# Patient Record
Sex: Female | Born: 1995
Health system: Southern US, Community
[De-identification: ages and names within clinical notes are randomized; demographics above are authoritative.]

## PROBLEM LIST (undated history)

## (undated) ENCOUNTER — Emergency Department (HOSPITAL_BASED_OUTPATIENT_CLINIC_OR_DEPARTMENT_OTHER): Admission: EM | Payer: Managed Care, Other (non HMO) | Source: Home / Self Care

---
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
## 2015-10-12 ENCOUNTER — Encounter (HOSPITAL_BASED_OUTPATIENT_CLINIC_OR_DEPARTMENT_OTHER): Payer: Self-pay | Admitting: Emergency Medicine

## 2015-10-12 ENCOUNTER — Emergency Department (HOSPITAL_BASED_OUTPATIENT_CLINIC_OR_DEPARTMENT_OTHER)
Admission: EM | Admit: 2015-10-12 | Discharge: 2015-10-12 | Disposition: A | Discharge status: Home / Self Care | Payer: Managed Care, Other (non HMO) | Attending: Emergency Medicine | Admitting: Emergency Medicine

## 2015-10-12 DIAGNOSIS — K644 Residual hemorrhoidal skin tags: Secondary | ICD-10-CM | POA: Insufficient documentation

## 2015-10-12 MED ORDER — HYDROCORTISONE 1 % RE CREA: TOPICAL_CREAM | RECTAL | Status: DC

## 2015-10-12 NOTE — ED Provider Notes (Signed)
CSN: [id number]     Arrival date & time 10/12/15  1824 History   First MD Initiated Contact with Patient 10/12/15 2032     Chief Complaint  Patient presents with  . Hemorrhoids    (Consider location/radiation/quality/duration/timing/severity/associated sxs/prior Treatment) The history is provided by the patient and medical records. No language interpreter was used.    Hailey Soto is an otherwise healthy 20 y.o. female  who presents to the Emergency Department complaining of a painless "bump" located near her rectum. Patient states she was wiping today when she felt it. Not tender to the touch. No abdominal pain or constipation. No blood in stool. No medications taken PTA for symptoms. No hx of similar.    History reviewed. No pertinent past medical history. History reviewed. No pertinent past surgical history. History reviewed. No pertinent family history. Social History  Substance Use Topics  . Smoking status: Never Smoker   . Smokeless tobacco: None  . Alcohol Use: No   OB History    No data available     Review of Systems  Constitutional: Negative for fever.  Gastrointestinal: Negative for nausea, vomiting, abdominal pain, constipation, blood in stool and anal bleeding.      Allergies  Review of patient's allergies indicates no known allergies.  Home Medications   Prior to Admission medications   Medication Sig Start Date End Date Taking? Authorizing Provider  HYDROcodone-acetaminophen (NORCO/VICODIN) 5-325 MG per tablet Take 1 tablet by mouth every 4 (four) hours as needed for pain. 06/21/12   Teressa LowerVrinda Pickering, NP  hydrocortisone (PROCTOCORT) 1 % CREA Apply to affected area twice daily for three days. 10/12/15   Chase PicketJaime Pilcher Tayah Idrovo, PA-C  ibuprofen (ADVIL,MOTRIN) 800 MG tablet Take 1 tablet (800 mg total) by mouth 3 (three) times daily. 04/12/13   April Palumbo, MD   BP 127/73 mmHg  Pulse 83  Temp(Src) 98.5 F (36.9 C) (Oral)  Resp 18  Ht 5\' 5"  (1.651 m)  Wt  56.7 kg  BMI 20.80 kg/m2  SpO2 100%  LMP 10/10/2015 Physical Exam  Constitutional: She is oriented to person, place, and time. She appears well-developed and well-nourished.  HENT:  Head: Normocephalic and atraumatic.  Cardiovascular: Normal rate, regular rhythm and normal heart sounds.  Exam reveals no gallop and no friction rub.   No murmur heard. Pulmonary/Chest: Effort normal and breath sounds normal. No respiratory distress. She has no wheezes. She has no rales.  Abdominal: Soft. Bowel sounds are normal. She exhibits no distension. There is no tenderness.  Genitourinary: Rectal exam shows external hemorrhoid. Rectal exam shows no tenderness.  Neurological: She is alert and oriented to person, place, and time.  Skin: Skin is warm and dry.  Nursing note and vitals reviewed.   ED Course  Procedures (including critical care time) Labs Review Labs Reviewed - No data to display  Imaging Review No results found. I have personally reviewed and evaluated these images and lab results as part of my medical decision-making.   EKG Interpretation None      MDM   Final diagnoses:  External hemorrhoid   Hailey SergeantShaquna Farner presents to ED for feeling a painless lump near her rectum today while wiping. On exam, patient has a nontender external hemorrhoid. No bleeding, not thrombosed. No constipation or straining with bowel movements. Benign abdominal exam. Will treat with steroid cream and have her follow-up with her PCP. Return precautions were discussed and all questions answered.  [facility] Hsptl[facility]ime Pilcher Dayrin Stallone, PA-C 10/12/15 2118  Melene Planan Floyd, DO 10/12/15  2123 

## 2015-10-12 NOTE — ED Notes (Signed)
Patient reports that she has a "bump" near her rectum - denies any Pain

## 2015-10-12 NOTE — Discharge Instructions (Signed)
Apply topical cream and asked her active. Follow-up with her primary physician in the next month. Increase hydration. If you have straining with bowel movements, use Colace (a stool softener). Return to ER for new or worsening symptoms, any additional concerns.

## 2017-05-21 ENCOUNTER — Encounter (HOSPITAL_BASED_OUTPATIENT_CLINIC_OR_DEPARTMENT_OTHER): Payer: Self-pay | Admitting: *Deleted

## 2017-05-21 ENCOUNTER — Emergency Department (HOSPITAL_BASED_OUTPATIENT_CLINIC_OR_DEPARTMENT_OTHER)
Admission: EM | Admit: 2017-05-21 | Discharge: 2017-05-21 | Discharge status: Against Medical Advice | Payer: Managed Care, Other (non HMO) | Attending: Emergency Medicine | Admitting: Emergency Medicine

## 2017-05-21 ENCOUNTER — Other Ambulatory Visit: Payer: Self-pay

## 2017-05-21 DIAGNOSIS — Z5321 Procedure and treatment not carried out due to patient leaving prior to being seen by health care provider: Secondary | ICD-10-CM | POA: Insufficient documentation

## 2017-05-21 DIAGNOSIS — M542 Cervicalgia: Secondary | ICD-10-CM | POA: Insufficient documentation

## 2017-05-21 NOTE — ED Triage Notes (Signed)
Pt c/o " swollen glands and neck pain x 2 years

## 2018-06-07 ENCOUNTER — Encounter (HOSPITAL_BASED_OUTPATIENT_CLINIC_OR_DEPARTMENT_OTHER): Payer: Self-pay | Admitting: Emergency Medicine

## 2018-06-07 ENCOUNTER — Telehealth: Payer: Self-pay | Admitting: *Deleted

## 2018-06-07 ENCOUNTER — Emergency Department (HOSPITAL_BASED_OUTPATIENT_CLINIC_OR_DEPARTMENT_OTHER)
Admission: EM | Admit: 2018-06-07 | Discharge: 2018-06-07 | Disposition: A | Discharge status: Home / Self Care | Payer: Managed Care, Other (non HMO) | Attending: Emergency Medicine | Admitting: Emergency Medicine

## 2018-06-07 ENCOUNTER — Other Ambulatory Visit: Payer: Self-pay

## 2018-06-07 DIAGNOSIS — R300 Dysuria: Secondary | ICD-10-CM | POA: Diagnosis present

## 2018-06-07 DIAGNOSIS — M545 Low back pain, unspecified: Secondary | ICD-10-CM

## 2018-06-07 LAB — PREGNANCY, URINE: Preg Test, Ur: NEGATIVE

## 2018-06-07 LAB — URINALYSIS, ROUTINE W REFLEX MICROSCOPIC
BILIRUBIN URINE: NEGATIVE
Glucose, UA: NEGATIVE mg/dL
Hgb urine dipstick: NEGATIVE
KETONES UR: NEGATIVE mg/dL
Leukocytes,Ua: NEGATIVE
NITRITE: NEGATIVE
PH: 7 (ref 5.0–8.0)
Protein, ur: NEGATIVE mg/dL
SPECIFIC GRAVITY, URINE: 1.015 (ref 1.005–1.030)

## 2018-06-07 NOTE — Telephone Encounter (Signed)
Dr. Laury Axon in not accepting new patients.

## 2018-06-07 NOTE — ED Notes (Signed)
C/o increased urinary freq,  White vaginal dc,  Denies burning w urination  X 1 month  Lower back pain last pm

## 2018-06-07 NOTE — ED Provider Notes (Signed)
[facility] EMERGENCY DEPARTMENT Provider Note   CSN: [id number] Arrival date & time: 06/07/18  1045    History   Chief Complaint Chief Complaint  Patient presents with  . Dysuria  . Flank Pain    HPI Hailey Soto is a 23 y.o. female.     23 year old female presents with complaint of low back discomfort with suprapubic cramping intermittent x1 month.  Reports frequent urinary tract infections, last seen in urgent care 1 month ago and diagnosed with UTI, prescribed Macrobid, did not complete the full course.  Review of records shows a normal urinalysis and negative urine culture from that visit.  Patient reports fevers described as feeling hot while at work, has not checked her temperature.  Patient reports previously having vaginal discharge, not recently, described as white without odor or itching.  Denies concerns for sexually transmitted diseases.  Denies nausea, vomiting, changes in bowel habits.  Denies any other complaints or concerns.     No past medical history on file.  There are no active problems to display for this patient.   No past surgical history on file.   OB History   No obstetric history on file.      Home Medications    Prior to Admission medications   Not on File    Family History No family history on file.  Social History Social History   Tobacco Use  . Smoking status: Never Smoker  . Smokeless tobacco: Never Used  Substance Use Topics  . Alcohol use: No  . Drug use: No     Allergies   Ibuprofen   Review of Systems Review of Systems  Constitutional: Negative for chills, diaphoresis and fever.  Gastrointestinal: Negative for abdominal pain, constipation, diarrhea, nausea and vomiting.  Genitourinary: Positive for vaginal discharge. Negative for dysuria and hematuria.       Discharge in the past, none at this time.  Musculoskeletal: Positive for back pain.  Skin: Negative for rash and wound.    Allergic/Immunologic: Negative for immunocompromised state.  Hematological: Negative for adenopathy. Does not bruise/bleed easily.  Psychiatric/Behavioral: Negative for confusion.  All other systems reviewed and are negative.    Physical Exam Updated Vital Signs BP 117/68 (BP Location: Left Arm)   Pulse 78   Temp 98.1 F (36.7 C) (Oral)   Resp 16   Ht 5\' 6"  (1.676 m)   Wt 67.6 kg   LMP 05/21/2018   SpO2 100%   BMI 24.05 kg/m   Physical Exam Vitals signs and nursing note reviewed.  Constitutional:      General: She is not in acute distress.    Appearance: She is well-developed. She is not diaphoretic.  HENT:     Head: Normocephalic and atraumatic.  Cardiovascular:     Rate and Rhythm: Normal rate and regular rhythm.     Pulses: Normal pulses.     Heart sounds: Normal heart sounds.  Pulmonary:     Effort: Pulmonary effort is normal.     Breath sounds: Normal breath sounds.  Abdominal:     General: Abdomen is flat.     Tenderness: There is no abdominal tenderness. There is no right CVA tenderness or left CVA tenderness.  Musculoskeletal:        General: No tenderness.     Thoracic back: Normal.     Lumbar back: Normal.  Skin:    General: Skin is warm and dry.     Findings: No erythema or rash.  Neurological:  General: No focal deficit present.     Mental Status: She is alert and oriented to person, place, and time.  Psychiatric:        Behavior: Behavior normal.      ED Treatments / Results  Labs (all labs ordered are listed, but only abnormal results are displayed) Labs Reviewed  URINALYSIS, ROUTINE W REFLEX MICROSCOPIC  PREGNANCY, URINE  GC/CHLAMYDIA PROBE AMP ([place]) NOT AT [facility]    EKG None  Radiology No results found.  Procedures Procedures (including critical care time)  Medications Ordered in ED Medications - No data to display   Initial Impression / Assessment and Plan / ED Course  I have reviewed the triage vital signs and  the nursing notes.  Pertinent labs & imaging results that were available during my care of the patient were reviewed by me and considered in my medical decision making (see chart for details).  Clinical Course as of Jun 07 1144  Fri Jun 07, 2018  2179 23 year old female with complaint of left lower back pain intermittent x1 month, concern for urinary tract infection.  Patient reports lacerated tract infection 1 month ago, treated at urgent care with [facility] however urinalysis was normal and culture was negative.  Sam today patient does not have any CVA tenderness, no musculoskeletal tenderness, abdomen is soft and nontender.  Patient reports discharge in the past however none presently.  Urinalysis is negative, pregnancy test negative.  Due to concerns for recurrent urinary tract infections, gonorrhea chlamydia testing has been sent out.  Recommend patient follow-up with a primary care provider for further evaluation and treatment.   [LM]    Clinical Course User Index [LM] Jeannie Fend, PA-C    Final Clinical Impressions(s) / ED Diagnoses   Final diagnoses:  Acute low back pain without sciatica, unspecified back pain laterality    ED Discharge Orders    None       Jeannie Fend, PA-C 06/07/18 1146    Long, Arlyss Repress, MD 06/08/18 1255

## 2018-06-07 NOTE — Discharge Instructions (Addendum)
Your urine today does not show infection. Take Motrin or Tylenol as needed for back pain.  Drink plenty of water or other hydrating fluids, avoid caffeine.  Follow up with a primary care provider, referral given.

## 2018-06-07 NOTE — Telephone Encounter (Signed)
Called and LVM informing patient that Dr. Laury Axon isn't accepting new patients. Informed patient of other providers and provided call back number.

## 2018-06-07 NOTE — ED Triage Notes (Addendum)
Pt states she is having dysuria and left flank pain for about one month.  Hot flashes and cold chills.  Some white vaginal discharge.

## 2018-06-07 NOTE — Telephone Encounter (Signed)
Copied from CRM [id number]. Topic: Appointment Scheduling - Scheduling Inquiry for Clinic >> Jun 07, 2018 11:53 AM Leafy Ro wrote: Pt has [employer] and is looking for [place] provider [place]. Pt would like to est with dr Zola Button

## 2018-06-10 LAB — GC/CHLAMYDIA PROBE AMP (~~LOC~~) NOT AT ARMC
Chlamydia: NEGATIVE
NEISSERIA GONORRHEA: NEGATIVE

## 2018-10-27 ENCOUNTER — Encounter (HOSPITAL_BASED_OUTPATIENT_CLINIC_OR_DEPARTMENT_OTHER): Payer: Self-pay | Admitting: Emergency Medicine

## 2018-10-27 ENCOUNTER — Other Ambulatory Visit: Payer: Self-pay

## 2018-10-27 ENCOUNTER — Emergency Department (HOSPITAL_BASED_OUTPATIENT_CLINIC_OR_DEPARTMENT_OTHER)
Admission: EM | Admit: 2018-10-27 | Discharge: 2018-10-27 | Disposition: A | Discharge status: Home / Self Care | Payer: Managed Care, Other (non HMO) | Attending: Emergency Medicine | Admitting: Emergency Medicine

## 2018-10-27 DIAGNOSIS — K146 Glossodynia: Secondary | ICD-10-CM | POA: Diagnosis present

## 2018-10-27 DIAGNOSIS — K115 Sialolithiasis: Secondary | ICD-10-CM | POA: Diagnosis not present

## 2018-10-27 MED ORDER — TRAMADOL HCL 50 MG PO TABS: 50.0000 mg | ORAL_TABLET | Freq: Two times a day (BID) | ORAL | 0 refills | Status: AC

## 2018-10-27 MED ORDER — CEPHALEXIN 500 MG PO CAPS: 500.0000 mg | ORAL_CAPSULE | Freq: Four times a day (QID) | ORAL | 0 refills | Status: AC

## 2018-10-27 NOTE — ED Triage Notes (Signed)
Pt states "my glands are swollen and my throat is trying to hurt".

## 2018-10-27 NOTE — ED Provider Notes (Signed)
[facility] EMERGENCY DEPARTMENT Provider Note   CSN: [id number] Arrival date & time: 10/27/18  1247     History   Chief Complaint Chief Complaint  Patient presents with  . Sore Throat    HPI Hailey Soto is a 23 y.o. female.     Patient states she was eating a [employer] last night when she started to develop pain under her tongue.  It is described as a burning pain.  This morning she woke up and felt like her tongue was swollen in the left side of her jaw as well.  She states this is happened about 4 times in the past 4 years.  It is usually associated with eating food, although there is no particular food that causes it.  Patient states that her throat is okay, the pain is mostly localized to under the tongue and left side of the tongue.  She says when this is happened in the past she gets antibiotics from the emergency department or urgent care and that eventually goes away.      History reviewed. No pertinent past medical history.  There are no active problems to display for this patient.   History reviewed. No pertinent surgical history.   OB History   No obstetric history on file.      Home Medications    Prior to Admission medications   Not on File    Family History No family history on file.  Social History Social History   Tobacco Use  . Smoking status: Never Smoker  . Smokeless tobacco: Never Used  Substance Use Topics  . Alcohol use: Yes  . Drug use: No     Allergies   Ibuprofen   Review of Systems Review of Systems  Constitutional: Negative for chills and fever.  HENT: Positive for facial swelling. Negative for congestion, rhinorrhea and sore throat.   Respiratory: Negative for cough and choking.   Cardiovascular: Negative for chest pain.  Gastrointestinal: Negative for abdominal pain, nausea and vomiting.    Physical Exam Updated Vital Signs BP 113/74 (BP Location: Right Arm)   Pulse 78   Temp 98.2 F (36.8 C)  (Oral)   Resp 16   Ht 5\' 5"  (1.651 m)   Wt 68 kg   LMP 09/30/2018   SpO2 99%   BMI 24.96 kg/m   Physical Exam Constitutional:      Appearance: She is normal weight.     Comments: Mild pain.  Patient tearful at times.    HENT:     Head: Normocephalic and atraumatic.     Nose: No congestion or rhinorrhea.     Mouth/Throat:     Mouth: Mucous membranes are moist.     Pharynx: Pharyngeal swelling and posterior oropharyngeal erythema present.     Comments: Under the tongue the patient has some swelling of her salivary glands with some minor ulceration in appearance Neurological:     Mental Status: She is alert.      ED Treatments / Results  Labs (all labs ordered are listed, but only abnormal results are displayed) Labs Reviewed - No data to display  EKG None  Radiology No results found.  Procedures Procedures (including critical care time)  Medications Ordered in ED Medications - No data to display   Initial Impression / Assessment and Plan / ED Course  I have reviewed the triage vital signs and the nursing notes.  Pertinent labs & imaging results that were available during my care of the  patient were reviewed by me and considered in my medical decision making (see chart for details).        Patient is a 23 year old female comes in with a 1 day history of pain under her tongue who woke up this morning with swelling below and to the left side of the tongue.  No other facial swelling or swelling of the buccal mucosa was noted.  No lymphadenopathy was seen on exam.  Patient is afebrile.  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  Patient was given Keflex 500 4 times a day for 7 days as well as 10 of 50 mg tramadol because she developed an allergic rash to both Tylenol and NSAIDs.  Patient was advised to find a primary care provider as she has  not currently, but does have insurance, so that she can treat this more effectively, and perhaps get a referral to ENT if necessary.  Return precautions were discussed and patient sent home.  Final Clinical Impressions(s) / ED Diagnoses   Final diagnoses:  Sialolithiasis    ED Discharge Orders    None       Sandre Kittylson, Manav Pierotti K, MD 10/27/18 1746    Alvira MondaySchlossman, Erin, MD 10/29/18 1108

## 2018-10-27 NOTE — Discharge Instructions (Signed)
I believe your pain under your tongue is caused by salivary duct stones which can become infected occasionally.  We have prescribed you Keflex to take 4 times a day for 7 days to treat for possible infection.  The stones may continue to be a problem for you in the future, you would need to follow-up with a primary care provider who can perhaps refer you to a ear nose and throat specialist for more permanent treatment.  Please try to avoid foods that trigger this such as spicy foods or anything else that you know to cause the symptoms.  You can suck on hard candy or sour candies to help produce saliva to help pass the stone sooner.

## 2018-12-31 ENCOUNTER — Other Ambulatory Visit: Payer: Self-pay

## 2018-12-31 ENCOUNTER — Emergency Department (HOSPITAL_BASED_OUTPATIENT_CLINIC_OR_DEPARTMENT_OTHER): Payer: Managed Care, Other (non HMO)

## 2018-12-31 ENCOUNTER — Encounter (HOSPITAL_BASED_OUTPATIENT_CLINIC_OR_DEPARTMENT_OTHER): Payer: Self-pay | Admitting: Emergency Medicine

## 2018-12-31 ENCOUNTER — Emergency Department (HOSPITAL_BASED_OUTPATIENT_CLINIC_OR_DEPARTMENT_OTHER)
Admission: EM | Admit: 2018-12-31 | Discharge: 2018-12-31 | Disposition: A | Discharge status: Home / Self Care | Payer: Managed Care, Other (non HMO) | Attending: Emergency Medicine | Admitting: Emergency Medicine

## 2018-12-31 DIAGNOSIS — S3992XA Unspecified injury of lower back, initial encounter: Secondary | ICD-10-CM | POA: Diagnosis present

## 2018-12-31 DIAGNOSIS — S39012A Strain of muscle, fascia and tendon of lower back, initial encounter: Secondary | ICD-10-CM | POA: Diagnosis not present

## 2018-12-31 DIAGNOSIS — Y9389 Activity, other specified: Secondary | ICD-10-CM | POA: Diagnosis not present

## 2018-12-31 DIAGNOSIS — R51 Headache: Secondary | ICD-10-CM | POA: Diagnosis not present

## 2018-12-31 DIAGNOSIS — Y999 Unspecified external cause status: Secondary | ICD-10-CM | POA: Insufficient documentation

## 2018-12-31 DIAGNOSIS — Y92411 Interstate highway as the place of occurrence of the external cause: Secondary | ICD-10-CM | POA: Insufficient documentation

## 2018-12-31 DIAGNOSIS — M542 Cervicalgia: Secondary | ICD-10-CM | POA: Diagnosis not present

## 2018-12-31 MED ORDER — CYCLOBENZAPRINE HCL 10 MG PO TABS: 10.0000 mg | ORAL_TABLET | Freq: Three times a day (TID) | ORAL | 0 refills | Status: DC | PRN

## 2018-12-31 MED FILL — CYCLOBENZAPRINE HCL 10 MG T: 10 | 5 days supply | Qty: 15 | Fill #0

## 2018-12-31 NOTE — ED Notes (Signed)
Patient transported to X-ray 

## 2018-12-31 NOTE — ED Notes (Signed)
Pt given water 
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
## 2018-12-31 NOTE — Discharge Instructions (Signed)
If you develop worsening, recurrent, or continued back pain, severe headache/neck pain, numbness or weakness in the arms or legs, incontinence of your bowels or bladders, numbness of your buttocks, fever, abdominal pain, or any other new/concerning symptoms then return to the ER for evaluation.

## 2018-12-31 NOTE — ED Notes (Signed)
Pt verbalized understanding of dc instructions.

## 2019-01-08 ENCOUNTER — Emergency Department (HOSPITAL_BASED_OUTPATIENT_CLINIC_OR_DEPARTMENT_OTHER)
Admission: EM | Admit: 2019-01-08 | Discharge: 2019-01-08 | Disposition: A | Discharge status: Home / Self Care | Payer: Managed Care, Other (non HMO) | Attending: Emergency Medicine | Admitting: Emergency Medicine

## 2019-01-08 ENCOUNTER — Emergency Department (HOSPITAL_BASED_OUTPATIENT_CLINIC_OR_DEPARTMENT_OTHER): Payer: Managed Care, Other (non HMO)

## 2019-01-08 ENCOUNTER — Encounter (HOSPITAL_BASED_OUTPATIENT_CLINIC_OR_DEPARTMENT_OTHER): Payer: Self-pay | Admitting: *Deleted

## 2019-01-08 ENCOUNTER — Other Ambulatory Visit: Payer: Self-pay

## 2019-01-08 DIAGNOSIS — G44319 Acute post-traumatic headache, not intractable: Secondary | ICD-10-CM | POA: Diagnosis not present

## 2019-01-08 DIAGNOSIS — R51 Headache: Secondary | ICD-10-CM | POA: Diagnosis present

## 2019-01-08 LAB — PREGNANCY, URINE: Preg Test, Ur: NEGATIVE

## 2019-01-08 MED ORDER — ONDANSETRON 8 MG PO TBDP
8.0000 mg | ORAL_TABLET | Freq: Once | ORAL | Status: AC
  Administered 2019-01-08: 8 mg via ORAL
  Filled 2019-01-08: qty 1

## 2019-01-08 MED ORDER — ONDANSETRON 4 MG PO TBDP: 4.0000 mg | ORAL_TABLET | Freq: Three times a day (TID) | ORAL | 0 refills | Status: DC | PRN

## 2019-01-08 MED FILL — ONDANSETRON ODT 4 MG TABLET: 4 | 2 days supply | Qty: 9 | Fill #0

## 2019-01-08 NOTE — ED Notes (Signed)
Patient transported to CT 
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
## 2019-01-08 NOTE — ED Notes (Signed)
ED Provider at bedside. 
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
## 2019-04-24 ENCOUNTER — Encounter (HOSPITAL_BASED_OUTPATIENT_CLINIC_OR_DEPARTMENT_OTHER): Payer: Self-pay | Admitting: *Deleted

## 2019-04-24 ENCOUNTER — Other Ambulatory Visit: Payer: Self-pay

## 2019-04-24 ENCOUNTER — Emergency Department (HOSPITAL_BASED_OUTPATIENT_CLINIC_OR_DEPARTMENT_OTHER)
Admission: EM | Admit: 2019-04-24 | Discharge: 2019-04-24 | Disposition: A | Discharge status: Home / Self Care | Payer: Managed Care, Other (non HMO) | Attending: Emergency Medicine | Admitting: Emergency Medicine

## 2019-04-24 DIAGNOSIS — R300 Dysuria: Secondary | ICD-10-CM | POA: Insufficient documentation

## 2019-04-24 DIAGNOSIS — R350 Frequency of micturition: Secondary | ICD-10-CM

## 2019-04-24 LAB — URINALYSIS, ROUTINE W REFLEX MICROSCOPIC
Bilirubin Urine: NEGATIVE
Glucose, UA: NEGATIVE mg/dL
Hgb urine dipstick: NEGATIVE
Ketones, ur: 40 mg/dL — AB
Leukocytes,Ua: NEGATIVE
Nitrite: NEGATIVE
Protein, ur: NEGATIVE mg/dL
Specific Gravity, Urine: >1.03 — ABNORMAL HIGH (ref 1.005–1.030)
pH: 6 (ref 5.0–8.0)

## 2019-04-24 LAB — CBG MONITORING, ED: Glucose-Capillary: 88 mg/dL (ref 70–99)

## 2019-04-24 LAB — PREGNANCY, URINE: Preg Test, Ur: NEGATIVE

## 2019-04-24 MED ORDER — NITROFURANTOIN MONOHYD MACRO 100 MG PO CAPS: 100.0000 mg | ORAL_CAPSULE | Freq: Two times a day (BID) | ORAL | 0 refills | Status: AC

## 2019-04-24 NOTE — ED Provider Notes (Signed)
[facility] EMERGENCY DEPARTMENT Provider Note   CSN: [id number] Arrival date & time: 04/24/19  1759     History Chief Complaint  Patient presents with  . Dysuria    Hailey Soto is a 24 y.o. female.  Who presents emergency department chief complaint of urinary frequency.  Patient states that this been going on for about 2 weeks.  She feels that she is urinating frequently and sometimes is unable to produce urine.  She has a history of recurrent urinary tract infections but denies any suprapubic pain, flank pain, fevers, hematuria, dysuria, foul odor of urine.  She denies any vaginal symptoms and is unconcerned for STI.  She denies polyuria, polydipsia, polyphagia or weight loss.  HPI     History reviewed. No pertinent past medical history.  There are no problems to display for this patient.   History reviewed. No pertinent surgical history.   OB History   No obstetric history on file.     No family history on file.  Social History   Tobacco Use  . Smoking status: Never Smoker  . Smokeless tobacco: Never Used  Substance Use Topics  . Alcohol use: Yes  . Drug use: No    Home Medications Prior to Admission medications   Medication Sig Start Date End Date Taking? Authorizing Provider  cyclobenzaprine (FLEXERIL) 10 MG tablet Take 1 tablet (10 mg total) by mouth 3 (three) times daily as needed for muscle spasms. 12/31/18   Pricilla Loveless, MD  ondansetron (ZOFRAN ODT) 4 MG disintegrating tablet Take 1 tablet (4 mg total) by mouth every 8 (eight) hours as needed for nausea or vomiting. 01/08/19   Terrilee Files, MD    Allergies    Ibuprofen  Review of Systems   Review of Systems Ten systems reviewed and are negative for acute change, except as noted in the HPI.   Physical Exam Updated Vital Signs BP 124/86   Pulse 68   Temp 98.2 F (36.8 C) (Oral)   Resp 18   Ht 5\' 5"  (1.651 m)   Wt 62.1 kg   LMP 04/01/2019   SpO2 100%   BMI 22.80 kg/m    Physical Exam Physical Exam  Nursing note and vitals reviewed. Constitutional: She is oriented to person, place, and time. She appears well-developed and well-nourished. No distress.  HENT:  Head: Normocephalic and atraumatic.  Eyes: Conjunctivae normal and EOM are normal. Pupils are equal, round, and reactive to light. No scleral icterus.  Neck: Normal range of motion.  Cardiovascular: Normal rate, regular rhythm and normal heart sounds.  Exam reveals no gallop and no friction rub.   No murmur heard. Pulmonary/Chest: Effort normal and breath sounds normal. No respiratory distress.  Abdominal: Soft. Bowel sounds are normal. She exhibits no distension and no mass. There is no tenderness. There is no guarding. No CVA tenderness Neurological: She is alert and oriented to person, place, and time.  Skin: Skin is warm and dry. She is not diaphoretic.    ED Results / Procedures / Treatments   Labs (all labs ordered are listed, but only abnormal results are displayed) Labs Reviewed  URINALYSIS, ROUTINE W REFLEX MICROSCOPIC - Abnormal; Notable for the following components:      Result Value   Specific Gravity, Urine >1.030 (*)    Ketones, ur 40 (*)    All other components within normal limits  PREGNANCY, URINE  CBG MONITORING, ED    EKG None  Radiology No results found.  Procedures  Procedures (including critical care time)  Medications Ordered in ED Medications - No data to display  ED Course  I have reviewed the triage vital signs and the nursing notes.  Pertinent labs & imaging results that were available during my care of the patient were reviewed by me and considered in my medical decision making (see chart for details).    MDM Rules/Calculators/A&P                      Patient here with frequent urination.  Her CBG is normal.  I reviewed the patient's urine which does not appear infected at this time.  She does have some dehydration with a high specific gravity as  well as some ketones suggestive that she has not eaten in some time.  Given prescription for Macrobid to hold in case of signs of infection including suprapubic pain, flank pain, fevers, dysuria and hematuria.  Patient understands and will hold unless she has the signs and is encouraged to follow up with a provider if that occurs.  I have also given the patient a referral to urology should she continue to have urinary symptoms.  She appears otherwise appropriate for discharge at this time Final Clinical Impression(s) / ED Diagnoses Final diagnoses:  None    Rx / DC Orders ED Discharge Orders    None       Margarita Mail, PA-C 04/24/19 1926    Malvin Johns, MD 04/24/19 [phone]

## 2019-04-24 NOTE — ED Triage Notes (Signed)
Dysuria x 2 weeks. Frequency. Hx of frequent UTI's.

## 2019-04-24 NOTE — Discharge Instructions (Signed)
Contact a health care provider if: You start urinating more often. You feel pain or irritation when you urinate. You notice blood in your urine. Your urine looks cloudy. You develop a fever. You begin vomiting. Get help right away if: You are unable to urinate. 

## 2019-05-09 ENCOUNTER — Emergency Department (HOSPITAL_BASED_OUTPATIENT_CLINIC_OR_DEPARTMENT_OTHER)
Admission: EM | Admit: 2019-05-09 | Discharge: 2019-05-10 | Disposition: A | Discharge status: Home / Self Care | Payer: Managed Care, Other (non HMO) | Attending: Emergency Medicine | Admitting: Emergency Medicine

## 2019-05-09 ENCOUNTER — Emergency Department (HOSPITAL_BASED_OUTPATIENT_CLINIC_OR_DEPARTMENT_OTHER): Payer: Managed Care, Other (non HMO)

## 2019-05-09 ENCOUNTER — Encounter (HOSPITAL_BASED_OUTPATIENT_CLINIC_OR_DEPARTMENT_OTHER): Payer: Self-pay

## 2019-05-09 ENCOUNTER — Other Ambulatory Visit: Payer: Self-pay

## 2019-05-09 DIAGNOSIS — K529 Noninfective gastroenteritis and colitis, unspecified: Secondary | ICD-10-CM | POA: Insufficient documentation

## 2019-05-09 DIAGNOSIS — Z720 Tobacco use: Secondary | ICD-10-CM | POA: Diagnosis not present

## 2019-05-09 DIAGNOSIS — Z3202 Encounter for pregnancy test, result negative: Secondary | ICD-10-CM | POA: Diagnosis not present

## 2019-05-09 DIAGNOSIS — R1084 Generalized abdominal pain: Secondary | ICD-10-CM | POA: Diagnosis present

## 2019-05-09 LAB — COMPREHENSIVE METABOLIC PANEL
ALT: 14 U/L (ref 0–44)
AST: 19 U/L (ref 15–41)
Albumin: 5.2 g/dL — ABNORMAL HIGH (ref 3.5–5.0)
Alkaline Phosphatase: 51 U/L (ref 38–126)
Anion gap: 9 (ref 5–15)
BUN: 16 mg/dL (ref 6–20)
CO2: 27 mmol/L (ref 22–32)
Calcium: 9.7 mg/dL (ref 8.9–10.3)
Chloride: 103 mmol/L (ref 98–111)
Creatinine, Ser: 0.76 mg/dL (ref 0.44–1.00)
GFR calc Af Amer: >60 mL/min (ref >60)
GFR calc non Af Amer: >60 mL/min (ref >60)
Glucose, Bld: 87 mg/dL (ref 70–99)
Potassium: 3.6 mmol/L (ref 3.5–5.1)
Sodium: 139 mmol/L (ref 135–145)
Total Bilirubin: 0.6 mg/dL (ref 0.3–1.2)
Total Protein: 8.4 g/dL — ABNORMAL HIGH (ref 6.5–8.1)

## 2019-05-09 LAB — CBC
HCT: 39.8 % (ref 36.0–46.0)
Hemoglobin: 12.7 g/dL (ref 12.0–15.0)
MCH: 30.8 pg (ref 26.0–34.0)
MCHC: 31.9 g/dL (ref 30.0–36.0)
MCV: 96.4 fL (ref 80.0–100.0)
Platelets: 166 10*3/uL (ref 150–400)
RBC: 4.13 MIL/uL (ref 3.87–5.11)
RDW: 11.8 % (ref 11.5–15.5)
WBC: 6.8 10*3/uL (ref 4.0–10.5)
nRBC: 0 % (ref 0.0–0.2)

## 2019-05-09 LAB — LIPASE, BLOOD: Lipase: 23 U/L (ref 11–51)

## 2019-05-09 LAB — URINALYSIS, ROUTINE W REFLEX MICROSCOPIC
Bilirubin Urine: NEGATIVE
Glucose, UA: NEGATIVE mg/dL
Hgb urine dipstick: NEGATIVE
Ketones, ur: 15 mg/dL — AB
Leukocytes,Ua: NEGATIVE
Nitrite: NEGATIVE
Protein, ur: NEGATIVE mg/dL
Specific Gravity, Urine: 1.02 (ref 1.005–1.030)
pH: 8 (ref 5.0–8.0)

## 2019-05-09 LAB — PREGNANCY, URINE: Preg Test, Ur: NEGATIVE

## 2019-05-09 MED ORDER — SODIUM CHLORIDE 0.9 % IV BOLUS
1000.0000 mL | Freq: Once | INTRAVENOUS | Status: AC
  Administered 2019-05-09: 1000 mL via INTRAVENOUS

## 2019-05-09 NOTE — ED Notes (Signed)
ED Provider at bedside. 

## 2019-05-09 NOTE — ED Provider Notes (Signed)
[facility] Provider Note   CSN: [id number] Arrival date & time: 05/09/19  1836     History Chief Complaint  Patient presents with  . Abdominal Pain    Hailey Soto is a 24 y.o. female.  Patient is a 24 year old female with no significant past medical history.  She presents today with complaints of abdominal pain.  Patient reports generalized abdominal cramping that has worsened over the past several days.  This is worse when she eats or drinks.   She states she has felt constipated, however did have a bowel movement today with no relief of her discomfort.  She denies fevers or chills.  She denies urinary complaints.  The history is provided by the patient.  Abdominal Pain Pain location:  Generalized Pain quality: cramping   Pain radiates to:  Does not radiate Pain severity:  Moderate Onset quality:  Gradual Duration:  3 days Timing:  Constant Progression:  Worsening Chronicity:  New Relieved by:  Nothing Worsened by:  Nothing      History reviewed. No pertinent past medical history.  There are no problems to display for this patient.   History reviewed. No pertinent surgical history.   OB History   No obstetric history on file.     No family history on file.  Social History   Tobacco Use  . Smoking status: Current Some Day Smoker  . Smokeless tobacco: Never Used  Substance Use Topics  . Alcohol use: Yes  . Drug use: No    Home Medications Prior to Admission medications   Medication Sig Start Date End Date Taking? Authorizing Provider  nitrofurantoin, macrocrystal-monohydrate, (MACROBID) 100 MG capsule Take 1 capsule (100 mg total) by mouth 2 (two) times daily. 04/24/19   Margarita Mail, PA-C    Allergies    Ibuprofen  Review of Systems   Review of Systems  Gastrointestinal: Positive for abdominal pain.  All other systems reviewed and are negative.   Physical Exam Updated Vital Signs BP 125/75 (BP Location:  Right Arm)   Pulse 67   Temp 98.7 F (37.1 C) (Oral)   Resp 20   Ht 5\' 5"  (1.651 m)   Wt 61.7 kg   LMP 04/28/2019   SpO2 100%   BMI 22.63 kg/m   Physical Exam Vitals and nursing note reviewed.  Constitutional:      General: She is not in acute distress.    Appearance: She is well-developed. She is not diaphoretic.  HENT:     Head: Normocephalic and atraumatic.  Cardiovascular:     Rate and Rhythm: Normal rate and regular rhythm.     Heart sounds: No murmur. No friction rub. No gallop.   Pulmonary:     Effort: Pulmonary effort is normal. No respiratory distress.     Breath sounds: Normal breath sounds. No wheezing.  Abdominal:     General: Bowel sounds are normal. There is no distension.     Palpations: Abdomen is soft.     Tenderness: There is generalized abdominal tenderness. There is no right CVA tenderness, left CVA tenderness, guarding or rebound.  Musculoskeletal:        General: Normal range of motion.     Cervical back: Normal range of motion and neck supple.  Skin:    General: Skin is warm and dry.  Neurological:     Mental Status: She is alert and oriented to person, place, and time.     ED Results / Procedures / Treatments  Labs (all labs ordered are listed, but only abnormal results are displayed) Labs Reviewed  COMPREHENSIVE METABOLIC PANEL - Abnormal; Notable for the following components:      Result Value   Total Protein 8.4 (*)    Albumin 5.2 (*)    All other components within normal limits  URINALYSIS, ROUTINE W REFLEX MICROSCOPIC - Abnormal; Notable for the following components:   Ketones, ur 15 (*)    All other components within normal limits  LIPASE, BLOOD  CBC  PREGNANCY, URINE    EKG None  Radiology No results found.  Procedures Procedures (including critical care time)  Medications Ordered in ED Medications  sodium chloride 0.9 % bolus 1,000 mL (has no administration in time range)    ED Course  I have reviewed the triage  vital signs and the nursing notes.  Pertinent labs & imaging results that were available during my care of the patient were reviewed by me and considered in my medical decision making (see chart for details).    MDM Rules/Calculators/A&P  Patient presenting here with complaints of abdominal discomfort for the past several days.  Her laboratory studies are reassuring and vital signs are stable.  I did obtain a CT scan which shows possible enteritis, however no other intra-abdominal abnormality.  At this point, I feel as though patient is appropriate for discharge.  She is to take Tylenol/ibuprofen as needed and follow-up if not improving.  Final Clinical Impression(s) / ED Diagnoses Final diagnoses:  None    Rx / DC Orders ED Discharge Orders    None       Geoffery Lyons, MD 05/10/19 [phone]

## 2019-05-09 NOTE — ED Triage Notes (Signed)
Pt c/o abd pain and cramping x 4 days. Pain is worse after eating. Pt also states she is constipated. Pt recently treated with Abx for UTI.

## 2019-05-10 MED ORDER — IOHEXOL 300 MG/ML  SOLN
100.0000 mL | Freq: Once | INTRAMUSCULAR | Status: AC | PRN
  Administered 2019-05-10: 100 mL via INTRAVENOUS

## 2019-05-10 NOTE — Discharge Instructions (Addendum)
Tylenol 1000 mg rotated with ibuprofen 600 mg every 4 hours as needed for pain.  Return to the emergency department if you develop worsening pain, high fever, bloody stool, or other new and concerning symptoms.

## 2019-10-12 ENCOUNTER — Emergency Department (HOSPITAL_BASED_OUTPATIENT_CLINIC_OR_DEPARTMENT_OTHER)
Admission: EM | Admit: 2019-10-12 | Discharge: 2019-10-12 | Disposition: A | Discharge status: Home / Self Care | Payer: Managed Care, Other (non HMO) | Attending: Emergency Medicine | Admitting: Emergency Medicine

## 2019-10-12 ENCOUNTER — Encounter (HOSPITAL_BASED_OUTPATIENT_CLINIC_OR_DEPARTMENT_OTHER): Payer: Self-pay | Admitting: Emergency Medicine

## 2019-10-12 ENCOUNTER — Other Ambulatory Visit: Payer: Self-pay

## 2019-10-12 DIAGNOSIS — Z5321 Procedure and treatment not carried out due to patient leaving prior to being seen by health care provider: Secondary | ICD-10-CM | POA: Diagnosis not present

## 2019-10-12 DIAGNOSIS — R102 Pelvic and perineal pain: Secondary | ICD-10-CM | POA: Insufficient documentation

## 2019-10-12 LAB — URINALYSIS, ROUTINE W REFLEX MICROSCOPIC
Bilirubin Urine: NEGATIVE
Glucose, UA: NEGATIVE mg/dL
Hgb urine dipstick: NEGATIVE
Ketones, ur: NEGATIVE mg/dL
Leukocytes,Ua: NEGATIVE
Nitrite: NEGATIVE
Protein, ur: NEGATIVE mg/dL
Specific Gravity, Urine: 1.025 (ref 1.005–1.030)
pH: 6.5 (ref 5.0–8.0)

## 2019-10-12 LAB — PREGNANCY, URINE: Preg Test, Ur: NEGATIVE

## 2019-10-12 NOTE — ED Notes (Signed)
Called x1 for treatment room, no answer 

## 2019-10-12 NOTE — ED Notes (Addendum)
Called x1 for treatment room, no answer 

## 2019-10-12 NOTE — ED Triage Notes (Signed)
Pelvic pain x 3 days. Denies vaginal discharge.

## 2021-02-27 ENCOUNTER — Other Ambulatory Visit: Payer: Self-pay

## 2021-02-27 ENCOUNTER — Emergency Department (HOSPITAL_BASED_OUTPATIENT_CLINIC_OR_DEPARTMENT_OTHER): Payer: PRIVATE HEALTH INSURANCE

## 2021-02-27 ENCOUNTER — Emergency Department (HOSPITAL_BASED_OUTPATIENT_CLINIC_OR_DEPARTMENT_OTHER)
Admission: EM | Admit: 2021-02-27 | Discharge: 2021-02-27 | Disposition: A | Discharge status: Home / Self Care | Payer: PRIVATE HEALTH INSURANCE | Attending: Emergency Medicine | Admitting: Emergency Medicine

## 2021-02-27 ENCOUNTER — Encounter (HOSPITAL_BASED_OUTPATIENT_CLINIC_OR_DEPARTMENT_OTHER): Payer: Self-pay | Admitting: Emergency Medicine

## 2021-02-27 DIAGNOSIS — F17200 Nicotine dependence, unspecified, uncomplicated: Secondary | ICD-10-CM | POA: Diagnosis not present

## 2021-02-27 DIAGNOSIS — M542 Cervicalgia: Secondary | ICD-10-CM | POA: Diagnosis not present

## 2021-02-27 DIAGNOSIS — R42 Dizziness and giddiness: Secondary | ICD-10-CM | POA: Insufficient documentation

## 2021-02-27 DIAGNOSIS — M545 Low back pain, unspecified: Secondary | ICD-10-CM | POA: Diagnosis not present

## 2021-02-27 DIAGNOSIS — Y92410 Unspecified street and highway as the place of occurrence of the external cause: Secondary | ICD-10-CM | POA: Insufficient documentation

## 2021-02-27 DIAGNOSIS — R519 Headache, unspecified: Secondary | ICD-10-CM | POA: Insufficient documentation

## 2021-02-27 DIAGNOSIS — M546 Pain in thoracic spine: Secondary | ICD-10-CM | POA: Diagnosis not present

## 2021-02-27 LAB — PREGNANCY, URINE: Preg Test, Ur: NEGATIVE

## 2021-02-27 LAB — CBG MONITORING, ED: Glucose-Capillary: 83 mg/dL (ref 70–99)

## 2021-02-27 MED ORDER — CYCLOBENZAPRINE HCL 10 MG PO TABS: 10.0000 mg | ORAL_TABLET | Freq: Two times a day (BID) | ORAL | 0 refills | Status: AC | PRN

## 2021-02-27 MED ORDER — LIDOCAINE 5 % EX PTCH
1.0000 | MEDICATED_PATCH | CUTANEOUS | Status: DC
  Administered 2021-02-27: 1 via TRANSDERMAL
  Filled 2021-02-27: qty 1

## 2021-02-27 MED ORDER — CYCLOBENZAPRINE HCL 10 MG PO TABS
10.0000 mg | ORAL_TABLET | Freq: Two times a day (BID) | ORAL | 0 refills | Status: DC | PRN
  Filled 2021-02-27: qty 20, 10d supply, fill #0
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
## 2021-02-27 NOTE — Discharge Instructions (Signed)
Take Flexeril up to twice daily at night as needed for muscle spasms.  Do not take this during the day as it can impair your ability to drive.  Continue taking Tylenol as needed for headaches.  Return to the ED if things change or worsen.
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
## 2021-02-28 ENCOUNTER — Other Ambulatory Visit (HOSPITAL_BASED_OUTPATIENT_CLINIC_OR_DEPARTMENT_OTHER): Payer: Self-pay
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
